# Patient Record
Sex: Male | Born: 1935 | Race: White | Hispanic: No | Smoking: Never smoker
Health system: Southern US, Community
[De-identification: ages and names within clinical notes are randomized; demographics above are authoritative.]

## PROBLEM LIST (undated history)

## (undated) DIAGNOSIS — B1920 Unspecified viral hepatitis C without hepatic coma: Secondary | ICD-10-CM

## (undated) DIAGNOSIS — C61 Malignant neoplasm of prostate: Secondary | ICD-10-CM

## (undated) DIAGNOSIS — J449 Chronic obstructive pulmonary disease, unspecified: Secondary | ICD-10-CM

## (undated) DIAGNOSIS — E119 Type 2 diabetes mellitus without complications: Secondary | ICD-10-CM

## (undated) HISTORY — PX: CHOLECYSTECTOMY: SHX55

---

## 2014-02-01 ENCOUNTER — Emergency Department (HOSPITAL_BASED_OUTPATIENT_CLINIC_OR_DEPARTMENT_OTHER): Payer: Medicare Other

## 2014-02-01 ENCOUNTER — Emergency Department (HOSPITAL_BASED_OUTPATIENT_CLINIC_OR_DEPARTMENT_OTHER)
Admission: EM | Admit: 2014-02-01 | Discharge: 2014-02-01 | Disposition: A | Discharge status: Other Acute Inpatient Hospital | Payer: Medicare Other | Attending: Emergency Medicine | Admitting: Emergency Medicine

## 2014-02-01 ENCOUNTER — Encounter (HOSPITAL_BASED_OUTPATIENT_CLINIC_OR_DEPARTMENT_OTHER): Payer: Self-pay | Admitting: Emergency Medicine

## 2014-02-01 DIAGNOSIS — M549 Dorsalgia, unspecified: Secondary | ICD-10-CM | POA: Insufficient documentation

## 2014-02-01 DIAGNOSIS — G9389 Other specified disorders of brain: Secondary | ICD-10-CM | POA: Diagnosis not present

## 2014-02-01 DIAGNOSIS — J449 Chronic obstructive pulmonary disease, unspecified: Secondary | ICD-10-CM | POA: Diagnosis not present

## 2014-02-01 DIAGNOSIS — R220 Localized swelling, mass and lump, head: Secondary | ICD-10-CM

## 2014-02-01 DIAGNOSIS — E119 Type 2 diabetes mellitus without complications: Secondary | ICD-10-CM | POA: Insufficient documentation

## 2014-02-01 DIAGNOSIS — R4182 Altered mental status, unspecified: Secondary | ICD-10-CM | POA: Insufficient documentation

## 2014-02-01 DIAGNOSIS — Z8619 Personal history of other infectious and parasitic diseases: Secondary | ICD-10-CM | POA: Insufficient documentation

## 2014-02-01 DIAGNOSIS — Z8546 Personal history of malignant neoplasm of prostate: Secondary | ICD-10-CM | POA: Insufficient documentation

## 2014-02-01 DIAGNOSIS — R404 Transient alteration of awareness: Secondary | ICD-10-CM | POA: Insufficient documentation

## 2014-02-01 DIAGNOSIS — J4489 Other specified chronic obstructive pulmonary disease: Secondary | ICD-10-CM | POA: Insufficient documentation

## 2014-02-01 DIAGNOSIS — R410 Disorientation, unspecified: Secondary | ICD-10-CM

## 2014-02-01 HISTORY — DX: Type 2 diabetes mellitus without complications: E11.9

## 2014-02-01 HISTORY — DX: Unspecified viral hepatitis C without hepatic coma: B19.20

## 2014-02-01 HISTORY — DX: Malignant neoplasm of prostate: C61

## 2014-02-01 HISTORY — DX: Chronic obstructive pulmonary disease, unspecified: J44.9

## 2014-02-01 LAB — CBC WITH DIFFERENTIAL/PLATELET
BASOS ABS: 0 10*3/uL (ref 0.0–0.1)
Basophils Relative: 0 % (ref 0–1)
EOS PCT: 3 % (ref 0–5)
Eosinophils Absolute: 0.3 10*3/uL (ref 0.0–0.7)
HEMATOCRIT: 46.9 % (ref 39.0–52.0)
Hemoglobin: 15.8 g/dL (ref 13.0–17.0)
LYMPHS ABS: 1.2 10*3/uL (ref 0.7–4.0)
LYMPHS PCT: 14 % (ref 12–46)
MCH: 31.9 pg (ref 26.0–34.0)
MCHC: 33.7 g/dL (ref 30.0–36.0)
MCV: 94.6 fL (ref 78.0–100.0)
MONO ABS: 0.6 10*3/uL (ref 0.1–1.0)
Monocytes Relative: 7 % (ref 3–12)
Neutro Abs: 6.5 10*3/uL (ref 1.7–7.7)
Neutrophils Relative %: 76 % (ref 43–77)
Platelets: 216 10*3/uL (ref 150–400)
RBC: 4.96 MIL/uL (ref 4.22–5.81)
RDW: 13.2 % (ref 11.5–15.5)
WBC: 8.6 10*3/uL (ref 4.0–10.5)

## 2014-02-01 LAB — URINALYSIS, ROUTINE W REFLEX MICROSCOPIC
Bilirubin Urine: NEGATIVE
Glucose, UA: NEGATIVE mg/dL
Hgb urine dipstick: NEGATIVE
KETONES UR: NEGATIVE mg/dL
Leukocytes, UA: NEGATIVE
NITRITE: NEGATIVE
PROTEIN: NEGATIVE mg/dL
Specific Gravity, Urine: 1.018 (ref 1.005–1.030)
Urobilinogen, UA: 1 mg/dL (ref 0.0–1.0)
pH: 5.5 (ref 5.0–8.0)

## 2014-02-01 LAB — COMPREHENSIVE METABOLIC PANEL
ALT: 33 U/L (ref 0–53)
AST: 32 U/L (ref 0–37)
Albumin: 3.3 g/dL — ABNORMAL LOW (ref 3.5–5.2)
Alkaline Phosphatase: 58 U/L (ref 39–117)
Anion gap: 11 (ref 5–15)
BUN: 17 mg/dL (ref 6–23)
CALCIUM: 9.5 mg/dL (ref 8.4–10.5)
CO2: 31 meq/L (ref 19–32)
CREATININE: 0.8 mg/dL (ref 0.50–1.35)
Chloride: 100 mEq/L (ref 96–112)
GFR, EST AFRICAN AMERICAN: 89 mL/min — AB (ref >90)
GFR, EST NON AFRICAN AMERICAN: 77 mL/min — AB (ref >90)
GLUCOSE: 179 mg/dL — AB (ref 70–99)
Potassium: 3.9 mEq/L (ref 3.7–5.3)
Sodium: 142 mEq/L (ref 137–147)
Total Bilirubin: 0.5 mg/dL (ref 0.3–1.2)
Total Protein: 6.7 g/dL (ref 6.0–8.3)

## 2014-02-01 LAB — CBG MONITORING, ED: Glucose-Capillary: 153 mg/dL — ABNORMAL HIGH (ref 70–99)

## 2014-02-01 LAB — TROPONIN I

## 2014-02-01 LAB — PRO B NATRIURETIC PEPTIDE: Pro B Natriuretic peptide (BNP): 327.5 pg/mL (ref 0–450)

## 2014-02-01 NOTE — ED Notes (Signed)
Pt presents to ED with family. Daughter states a week ago he fell hurt his upper back and stopped eating as much was seen at [facility]. Pt has not been acting himself since, not eating as much and not moving around as much and some disorientation. Patient lives alone.

## 2014-02-01 NOTE — ED Notes (Signed)
Transferred images via PACS to [facility] of CT head and DG chest 1 view at 2:43 pm on 02/01/14.

## 2014-02-01 NOTE — ED Notes (Signed)
Attempts for IV.Marland Kitchen

## 2014-02-01 NOTE — ED Provider Notes (Signed)
CSN: [id number]     Arrival date & time 02/01/14  1030 History   First MD Initiated Contact with Patient 02/01/14 1108     Chief Complaint  Patient presents with  . Altered Mental Status     (Consider location/radiation/quality/duration/timing/severity/associated sxs/prior Treatment) HPI 80 year old male presents with intermittent hallucinations and confusion over the last 2-3 days. Approximately one week ago he fell and injured his back at home. He followed up with his PCP earlier this week and was found to have a bronchitis and treated with Levaquin which she is finished with. He is also complaining of a transient headache and nausea that has since resolved. Has not had any fevers or chills. Patient lives by himself in family's been checking on him. They note that sometimes he is calling asking where they are in the middle the night at 4 AM. He's also confused on location intermittently as well as confused with thinking he's hearing children or grandchildren and his house. Family members confirmed there is no one in the house at the time.  Past Medical History  Diagnosis Date  . COPD (chronic obstructive pulmonary disease)   . Hepatitis C   . Diabetes mellitus without complication   . Prostate cancer    No past surgical history on file. No family history on file. History  Substance Use Topics  . Smoking status: Not on file  . Smokeless tobacco: Not on file  . Alcohol Use: Not on file    Review of Systems  Constitutional: Negative for fever.  Respiratory: Negative for shortness of breath.   Cardiovascular: Negative for chest pain.  Gastrointestinal: Negative for vomiting.  Genitourinary: Negative for dysuria.  Musculoskeletal: Positive for back pain.  Neurological: Negative for weakness.  Psychiatric/Behavioral: Positive for confusion.  All other systems reviewed and are negative.     Allergies  Review of patient's allergies indicates no known allergies.  Home  Medications   Prior to Admission medications   Not on File   BP 137/68  Pulse 89  Temp(Src) 98.3 F (36.8 C) (Oral)  Resp 18  Ht 6\' 1"  (1.854 m)  Wt 172 lb (78.019 kg)  BMI 22.70 kg/m2  SpO2 92% Physical Exam  Nursing note and vitals reviewed. Constitutional: He is oriented to person, place, and time. He appears well-developed and well-nourished.  HENT:  Head: Normocephalic and atraumatic.  Right Ear: External ear normal.  Left Ear: External ear normal.  Nose: Nose normal.  Eyes: EOM are normal. Pupils are equal, round, and reactive to light. Right eye exhibits no discharge. Left eye exhibits no discharge.  Neck: Neck supple.  Cardiovascular: Normal rate, regular rhythm, normal heart sounds and intact distal pulses.   Pulmonary/Chest: Effort normal and breath sounds normal.  Abdominal: Soft. He exhibits no distension. There is no tenderness.  Musculoskeletal: He exhibits no edema.  Neurological: He is alert and oriented to person, place, and time.  Mildly decreased strength in LLE compared to right. Equal strength in upper extremities. CN 2-12 grossly intact. Alert and oriented to self, place, day of week and month. Thinks it is 1920.  Skin: Skin is warm and dry.    ED Course  Procedures (including critical care time) Labs Review Labs Reviewed  COMPREHENSIVE METABOLIC PANEL - Abnormal; Notable for the following:    Glucose, Bld 179 (*)    Albumin 3.3 (*)    GFR calc non Af Amer 77 (*)    GFR calc Af Amer 89 (*)    All  other components within normal limits  CBG MONITORING, ED - Abnormal; Notable for the following:    Glucose-Capillary 153 (*)    All other components within normal limits  CBC WITH DIFFERENTIAL  TROPONIN I  URINALYSIS, ROUTINE W REFLEX MICROSCOPIC  PRO B NATRIURETIC PEPTIDE    Imaging Review Dg Chest 1 View  02/01/2014   CLINICAL DATA:  Altered mental status, decreased appetite  EXAM: CHEST - 1 VIEW  COMPARISON:  None  FINDINGS: Cardiomediastinal  silhouette is unremarkable. No acute infiltrate or pleural effusion. No pulmonary edema. Mild basilar atelectasis.  IMPRESSION: No acute infiltrate or pulmonary edema.  Mild basilar atelectasis.   Electronically Signed   By: Lahoma Crocker M.D.   On: 02/01/2014 12:19   Ct Head Wo Contrast  02/01/2014   CLINICAL DATA:  Altered mental status after fall.  EXAM: CT HEAD WITHOUT CONTRAST  TECHNIQUE: Contiguous axial images were obtained from the base of the skull through the vertex without intravenous contrast.  COMPARISON:  None.  FINDINGS: Mucosal thickening of bilateral ethmoid and sphenoid and left maxillary sinuses is noted. Bony calvarium is intact. Mild diffuse cortical atrophy is noted. Mild chronic ischemic white matter disease is noted. 19 mm rounded hyperdensity is seen in the suprasellar region concerning for possible aneurysm or mass. No mass effect or midline shift is noted. Ventricular size is within normal limits. There is no evidence of hemorrhage or acute infarction.  IMPRESSION: Findings consistent with ethmoid, sphenoid and left maxillary sinusitis.  Mild diffuse cortical atrophy. Mild chronic ischemic white matter disease.  19 mm rounded suprasellar hyperdensity is noted concerning for possible aneurysm or mass. Further evaluation with MRI or MRA is recommended.   Electronically Signed   By: Sabino Dick M.D.   On: 02/01/2014 12:19     EKG Interpretation   Date/Time:  Sunday February 01 2014 11:58:37 EDT Ventricular Rate:  89 PR Interval:  148 QRS Duration: 76 QT Interval:  372 QTC Calculation: 452 R Axis:   37 Text Interpretation:  Normal sinus rhythm Low voltage QRS Cannot rule out  Anterior infarct , age undetermined Abnormal ECG No old tracing to compare  Confirmed by Matthews ([id number]) on 02/01/2014 12:44:12 PM      MDM   Final diagnoses:  Delirium  Suprasellar mass    Patient with intermittent confusion consistent with delirium. Unknown source of his delirium  although there is a mass the suprasellar. Possible aneurysm as well although there is no evidence of blood. Given that he is been having intermittent confusion acutely he'll need admission to the hospital. Family prefers to be [facility] to [facility]. I discussed the case with Dr. Arther Dames, who accepts in transfer and admission. Will need MRI    Ephraim Hamburger, MD 02/01/14 1404

## 2014-02-01 NOTE — ED Notes (Signed)
CT head and 1 view cxr images were sent to [facility] pacs system by Darliss Ridgel radiology.

## 2014-02-01 NOTE — ED Notes (Signed)
Patient transported to CT 

## 2016-03-17 ENCOUNTER — Encounter (HOSPITAL_BASED_OUTPATIENT_CLINIC_OR_DEPARTMENT_OTHER): Payer: Self-pay | Admitting: *Deleted

## 2016-03-17 ENCOUNTER — Emergency Department (HOSPITAL_BASED_OUTPATIENT_CLINIC_OR_DEPARTMENT_OTHER): Payer: Medicare Other

## 2016-03-17 ENCOUNTER — Emergency Department (HOSPITAL_BASED_OUTPATIENT_CLINIC_OR_DEPARTMENT_OTHER)
Admission: EM | Admit: 2016-03-17 | Discharge: 2016-03-17 | Disposition: A | Discharge status: Home / Self Care | Payer: Medicare Other | Attending: Emergency Medicine | Admitting: Emergency Medicine

## 2016-03-17 DIAGNOSIS — Z8546 Personal history of malignant neoplasm of prostate: Secondary | ICD-10-CM | POA: Diagnosis not present

## 2016-03-17 DIAGNOSIS — Z7982 Long term (current) use of aspirin: Secondary | ICD-10-CM | POA: Diagnosis not present

## 2016-03-17 DIAGNOSIS — E119 Type 2 diabetes mellitus without complications: Secondary | ICD-10-CM | POA: Diagnosis not present

## 2016-03-17 DIAGNOSIS — R1030 Lower abdominal pain, unspecified: Secondary | ICD-10-CM

## 2016-03-17 DIAGNOSIS — Z79899 Other long term (current) drug therapy: Secondary | ICD-10-CM | POA: Insufficient documentation

## 2016-03-17 DIAGNOSIS — Z7984 Long term (current) use of oral hypoglycemic drugs: Secondary | ICD-10-CM | POA: Insufficient documentation

## 2016-03-17 DIAGNOSIS — J449 Chronic obstructive pulmonary disease, unspecified: Secondary | ICD-10-CM | POA: Diagnosis not present

## 2016-03-17 DIAGNOSIS — Z7951 Long term (current) use of inhaled steroids: Secondary | ICD-10-CM | POA: Insufficient documentation

## 2016-03-17 DIAGNOSIS — R197 Diarrhea, unspecified: Secondary | ICD-10-CM | POA: Insufficient documentation

## 2016-03-17 DIAGNOSIS — N5082 Scrotal pain: Secondary | ICD-10-CM | POA: Insufficient documentation

## 2016-03-17 LAB — COMPREHENSIVE METABOLIC PANEL
ALT: 13 U/L — AB (ref 17–63)
AST: 17 U/L (ref 15–41)
Albumin: 4 g/dL (ref 3.5–5.0)
Alkaline Phosphatase: 42 U/L (ref 38–126)
Anion gap: 6 (ref 5–15)
BUN: 19 mg/dL (ref 6–20)
CHLORIDE: 101 mmol/L (ref 101–111)
CO2: 30 mmol/L (ref 22–32)
CREATININE: 0.82 mg/dL (ref 0.61–1.24)
Calcium: 9.3 mg/dL (ref 8.9–10.3)
GFR calc non Af Amer: >60 mL/min (ref >60)
GLUCOSE: 103 mg/dL — AB (ref 65–99)
Potassium: 4.6 mmol/L (ref 3.5–5.1)
SODIUM: 137 mmol/L (ref 135–145)
Total Bilirubin: 0.8 mg/dL (ref 0.3–1.2)
Total Protein: 6.9 g/dL (ref 6.5–8.1)

## 2016-03-17 LAB — CBC WITH DIFFERENTIAL/PLATELET
Basophils Absolute: 0 K/uL (ref 0.0–0.1)
Basophils Relative: 0 %
Eosinophils Absolute: 0.3 K/uL (ref 0.0–0.7)
Eosinophils Relative: 4 %
HCT: 42.2 % (ref 39.0–52.0)
Hemoglobin: 13.9 g/dL (ref 13.0–17.0)
Lymphocytes Relative: 11 %
Lymphs Abs: 0.9 K/uL (ref 0.7–4.0)
MCH: 30.6 pg (ref 26.0–34.0)
MCHC: 32.9 g/dL (ref 30.0–36.0)
MCV: 93 fL (ref 78.0–100.0)
Monocytes Absolute: 0.6 K/uL (ref 0.1–1.0)
Monocytes Relative: 8 %
Neutro Abs: 6.1 K/uL (ref 1.7–7.7)
Neutrophils Relative %: 77 %
Platelets: 247 K/uL (ref 150–400)
RBC: 4.54 MIL/uL (ref 4.22–5.81)
RDW: 13.5 % (ref 11.5–15.5)
WBC: 8 K/uL (ref 4.0–10.5)

## 2016-03-17 LAB — URINALYSIS, ROUTINE W REFLEX MICROSCOPIC
BILIRUBIN URINE: NEGATIVE
Glucose, UA: NEGATIVE mg/dL
Ketones, ur: 15 mg/dL — AB
Leukocytes, UA: NEGATIVE
NITRITE: NEGATIVE
PROTEIN: NEGATIVE mg/dL
Specific Gravity, Urine: 1.019 (ref 1.005–1.030)
pH: 5.5 (ref 5.0–8.0)

## 2016-03-17 LAB — URINE MICROSCOPIC-ADD ON

## 2016-03-17 NOTE — ED Notes (Signed)
Pt able to stand and pivot from wheelchair to bed via 1 person MAX assist.

## 2016-03-17 NOTE — ED Provider Notes (Signed)
[facility] Provider Note   CSN: [id number] Arrival date & time: 03/17/16  1601     History   Chief Complaint Chief Complaint  Patient presents with  . Groin Pain    HPI Lee Frazier is a 82 y.o. male.Patient complains of scrotal pain for 6 months. His daughter reports that he only complained about to her today.. Patient denies difficulty with urination. No treatment prior to coming here. Other associated symptoms include diarrhea for several months. His last bowel movement was 3 days ago. He denies abdominal pain. No other associated symptoms. No nausea or vomiting. No fever. No treatment prior to coming here.  HPI  Past Medical History:  Diagnosis Date  . COPD (chronic obstructive pulmonary disease) ([place])   . Diabetes mellitus without complication ([place])   . Hepatitis C   . Prostate cancer ([place])     There are no active problems to display for this patient.   Past Surgical History:  Procedure Laterality Date  . CHOLECYSTECTOMY         Home Medications    Prior to Admission medications   Medication Sig Start Date End Date Taking? Authorizing Provider  Aspirin (ASPIR-81 PO) Take by mouth.   Yes Historical Provider, MD  Fluticasone Propionate HFA (FLOVENT HFA IN) Inhale into the lungs.   Yes Historical Provider, MD  Fluticasone-Salmeterol (ADVAIR HFA IN) Inhale into the lungs.   Yes Historical Provider, MD  glipiZIDE (GLUCOTROL) 5 MG tablet Take by mouth daily before breakfast.   Yes Historical Provider, MD  guaifenesin (MUCUS RELIEF) 400 MG TABS tablet Take 400 mg by mouth every 4 (four) hours.   Yes Historical Provider, MD  lisinopril (PRINIVIL,ZESTRIL) 10 MG tablet Take 10 mg by mouth daily.   Yes Historical Provider, MD  loperamide (IMODIUM) 2 MG capsule Take by mouth as needed for diarrhea or loose stools.   Yes Historical Provider, MD  Multiple Vitamin (THEREMS PO) Take by mouth.   Yes Historical Provider, MD  OXYGEN Inhale into the lungs.   Yes  Historical Provider, MD    Family History No family history on file.  Social History Social History  Substance Use Topics  . Smoking status: Never Smoker  . Smokeless tobacco: Never Used  . Alcohol use No     Allergies   Review of patient's allergies indicates no known allergies.   Review of Systems Review of Systems  Constitutional: Negative.   HENT: Negative.   Respiratory: Negative.   Cardiovascular: Negative.   Gastrointestinal: Negative.   Genitourinary: Positive for scrotal swelling.       Scrotal pain  Musculoskeletal: Positive for gait problem.       Walks with walker  Skin: Negative.   Allergic/Immunologic: Positive for immunocompromised state.       Diabetic  Psychiatric/Behavioral: Negative.   All other systems reviewed and are negative.    Physical Exam Updated Vital Signs BP 125/89 (BP Location: Right Arm)   Pulse 90   Temp 98.4 F (36.9 C)   Resp 18   Wt 190 lb (86.2 kg)   SpO2 94%   BMI 25.07 kg/m   Physical Exam  Constitutional:  Chronically ill-appearing  HENT:  Head: Normocephalic and atraumatic.  Eyes: Conjunctivae are normal. Pupils are equal, round, and reactive to light.  Neck: Neck supple. No tracheal deviation present. No thyromegaly present.  Cardiovascular: Normal rate and regular rhythm.   No murmur heard. Pulmonary/Chest: Effort normal and breath sounds normal.  Abdominal: Soft. Bowel sounds  are normal. He exhibits no distension. There is no tenderness.  Genitourinary: Penis normal.  Genitourinary Comments: Grapefruit size scrotum, him a firm minimally tender diffusely  Musculoskeletal: Normal range of motion. He exhibits no edema or tenderness.  Neurological: He is alert. Coordination normal.  Skin: Skin is warm and dry. No rash noted.  Psychiatric: He has a normal mood and affect.  Nursing note and vitals reviewed.    ED Treatments / Results  Labs (all labs ordered are listed, but only abnormal results are  displayed) Labs Reviewed  URINALYSIS, ROUTINE W REFLEX MICROSCOPIC (NOT AT [facility])  BASIC METABOLIC PANEL  CBC WITH DIFFERENTIAL/PLATELET    EKG  EKG Interpretation None       Radiology No results found.  Procedures Procedures (including critical care time)  Medications Ordered in ED Medications - No data to display Results for orders placed or performed during the hospital encounter of 03/17/16  Urinalysis, Routine w reflex microscopic (not at [facility])  Result Value Ref Range   Color, Urine YELLOW YELLOW   APPearance CLEAR CLEAR   Specific Gravity, Urine 1.019 1.005 - 1.030   pH 5.5 5.0 - 8.0   Glucose, UA NEGATIVE NEGATIVE mg/dL   Hgb urine dipstick SMALL (A) NEGATIVE   Bilirubin Urine NEGATIVE NEGATIVE   Ketones, ur 15 (A) NEGATIVE mg/dL   Protein, ur NEGATIVE NEGATIVE mg/dL   Nitrite NEGATIVE NEGATIVE   Leukocytes, UA NEGATIVE NEGATIVE  CBC with Differential/Platelet  Result Value Ref Range   WBC 8.0 4.0 - 10.5 K/uL   RBC 4.54 4.22 - 5.81 MIL/uL   Hemoglobin 13.9 13.0 - 17.0 g/dL   HCT 42.2 39.0 - 52.0 %   MCV 93.0 78.0 - 100.0 fL   MCH 30.6 26.0 - 34.0 pg   MCHC 32.9 30.0 - 36.0 g/dL   RDW 13.5 11.5 - 15.5 %   Platelets 247 150 - 400 K/uL   Neutrophils Relative % 77 %   Neutro Abs 6.1 1.7 - 7.7 K/uL   Lymphocytes Relative 11 %   Lymphs Abs 0.9 0.7 - 4.0 K/uL   Monocytes Relative 8 %   Monocytes Absolute 0.6 0.1 - 1.0 K/uL   Eosinophils Relative 4 %   Eosinophils Absolute 0.3 0.0 - 0.7 K/uL   Basophils Relative 0 %   Basophils Absolute 0.0 0.0 - 0.1 K/uL  Comprehensive metabolic panel  Result Value Ref Range   Sodium 137 135 - 145 mmol/L   Potassium 4.6 3.5 - 5.1 mmol/L   Chloride 101 101 - 111 mmol/L   CO2 30 22 - 32 mmol/L   Glucose, Bld 103 (H) 65 - 99 mg/dL   BUN 19 6 - 20 mg/dL   Creatinine, Ser 0.82 0.61 - 1.24 mg/dL   Calcium 9.3 8.9 - 10.3 mg/dL   Total Protein 6.9 6.5 - 8.1 g/dL   Albumin 4.0 3.5 - 5.0 g/dL   AST 17 15 - 41 U/L   ALT 13  (L) 17 - 63 U/L   Alkaline Phosphatase 42 38 - 126 U/L   Total Bilirubin 0.8 0.3 - 1.2 mg/dL   GFR calc non Af Amer >60 >60 mL/min   GFR calc Af Amer >60 >60 mL/min   Anion gap 6 5 - 15  Urine microscopic-add on  Result Value Ref Range   Squamous Epithelial / LPF 0-5 (A) NONE SEEN   WBC, UA 0-5 0 - 5 WBC/hpf   RBC / HPF 6-30 0 - 5 RBC/hpf   Bacteria,  UA RARE (A) NONE SEEN   Urine-Other MUCOUS PRESENT    US Scrotum  Result Date: 03/17/2016 CLINICAL DATA:  Scrotal swelling and pain over the past 6 months . EXAM: SCROTAL ULTRASOUND DOPPLER ULTRASOUND OF THE TESTICLES TECHNIQUE: Complete ultrasound examination of the testicles, epididymis, and other scrotal structures was performed. Color and spectral Doppler ultrasound were also utilized to evaluate blood flow to the testicles. COMPARISON:  None. FINDINGS: Right testicle Measurements: 4.1 x 2 x 2.3 cm. Anechoic 1.6 x 1.4 x 1.8 cm right upper pole testicular cyst with small internal indentation. No solid appearing intratesticular mass. Left testicle Measurements: 2.7 x 1.8 x 2.2 cm. No solid lesions. Upper pole 0.8 x 0.3 x 0.5 cm anechoic cyst. Right epididymis:  Not visualize Left epididymis:  Not visualized Hydrocele:  None visualized. Varicocele: Patient could not perform Valsalva of for assessment of varicoceles. Pulsed Doppler interrogation of both testes demonstrates normal low resistance arterial and venous waveforms bilaterally. Other: The testicles are displaced caudad by a but may represent some bulging of bowel cephalad to it. It is uncertain whether these represent areas of herniating bowel. Inguinal hernia is a clinical diagnosis. IMPRESSION: Downward bulging of bowel in the midline of the scrotum which may represent a hernia. The scrotal sac however is not filled with bowel. 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 No solid-appearing masses or evidence of testicular torsion. Electronically Signed   By: Ashley Royalty M.D.   On: 03/17/2016 18:12   [place] Art/ven Flow Abd Pelv Doppler  Result Date: 03/17/2016 CLINICAL DATA:  Scrotal swelling and pain over the past 6 months . EXAM: SCROTAL ULTRASOUND DOPPLER ULTRASOUND OF THE TESTICLES TECHNIQUE: Complete ultrasound examination of the testicles, epididymis, and other scrotal structures was performed. Color and spectral Doppler ultrasound were also utilized to evaluate blood flow to the testicles. COMPARISON:  None. FINDINGS: Right testicle Measurements: 4.1 x 2 x 2.3 cm. Anechoic 1.6 x 1.4 x 1.8 cm right upper pole testicular cyst with small internal indentation. No solid appearing intratesticular mass. Left testicle Measurements: 2.7 x 1.8 x 2.2 cm. No solid lesions. Upper pole 0.8 x 0.3 x 0.5 cm anechoic cyst. Right epididymis:  Not visualize Left epididymis:  Not visualized Hydrocele:  None visualized. Varicocele: Patient could not perform Valsalva of for assessment of varicoceles. Pulsed Doppler interrogation of both testes demonstrates normal low resistance arterial and venous waveforms bilaterally. Other: The testicles are displaced caudad by a but may represent some bulging of bowel cephalad to it. It is uncertain whether these represent areas of herniating bowel. Inguinal hernia is a clinical diagnosis. IMPRESSION: Downward bulging of bowel in the midline of the scrotum which may represent a hernia. The scrotal sac however is not filled with bowel. 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 No solid-appearing masses or evidence of testicular torsion. Electronically Signed   By: Ashley Royalty M.D.   On: 03/17/2016 18:12    Initial Impression / Assessment and Plan / ED Course  I have reviewed the triage vital signs and the nursing notes.  Pertinent labs & imaging results that were available during my care of the patient were reviewed by me and considered in my medical decision making (see chart for details).  Clinical Course    7:10 PM  and comfortably. Asymptomatic. Clinically likely cause of scrotal swelling is hernia. Doubt incarceration as patient is pain-free presently. His daughter request referral to gastric urologist. I suggest she contact primary care  physician for referral but I will refer her to GI on call. Also refer patient to [facility] surgery Final Clinical Impressions(s) / ED Diagnoses  Diagnosis #1 scrotal pain and swelling #2 chronic diarrhea Final diagnoses:  None    New Prescriptions New Prescriptions   No medications on file     Orlie Dakin, MD 03/17/16 1916

## 2016-03-17 NOTE — ED Triage Notes (Signed)
Groin pain. Testicles are swollen. He lives in an assisted living facility. Her daughter brought him here for evaluation.

## 2016-03-17 NOTE — ED Notes (Signed)
Pt is unsure how long his testicles have been swollen. Pt lives at assisted living and is not evaluated by a nurse. Pt daughter states today pt states the pain kept him up last night. Per daughter, pt has also been having diarrhea for several months.

## 2016-03-17 NOTE — Discharge Instructions (Signed)
We suspect that you have a hernia based on your exam and the ultrasound performed today. Call [facility] surgery if you wish to discuss surgery. If you develop severe pain in your scrotum vomiting or fever, return to the emergency department immediately. Call [facility] gastroenterology or contact your primary care physician for referral to gastroenterologist to discuss chronic diarrhea. Avoid milk or foods containing milk such as cheese or ice cream all having diarrhea. You can take Imodium as directed for diarrhea

## 2018-01-06 IMAGING — US US ART/VEN ABD/PELV/SCROTUM DOPPLER LTD
1 series · 13 of 25 positions shown · non-contrast
Comparison: None.

CLINICAL DATA: Scrotal swelling and pain over the past 6 months .

EXAM:
SCROTAL ULTRASOUND
DOPPLER ULTRASOUND OF THE TESTICLES
TECHNIQUE: Complete ultrasound examination of the testicles, epididymis, and
other scrotal structures was performed. Color and spectral Doppler
ultrasound were also utilized to evaluate blood flow to the
testicles.

[Series 1: us art/ven abd/pelv/scrotum doppler ltd · 0.08mm/px · 13 of 81 slices shown]
[im 1/81]
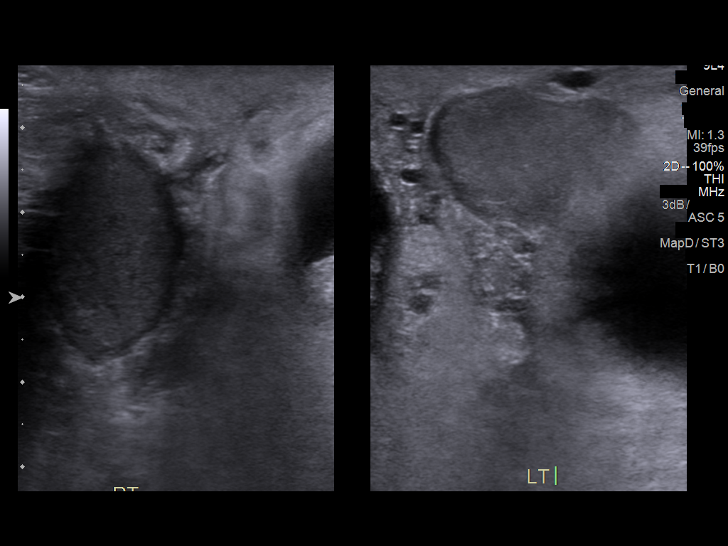
[im 7/81]
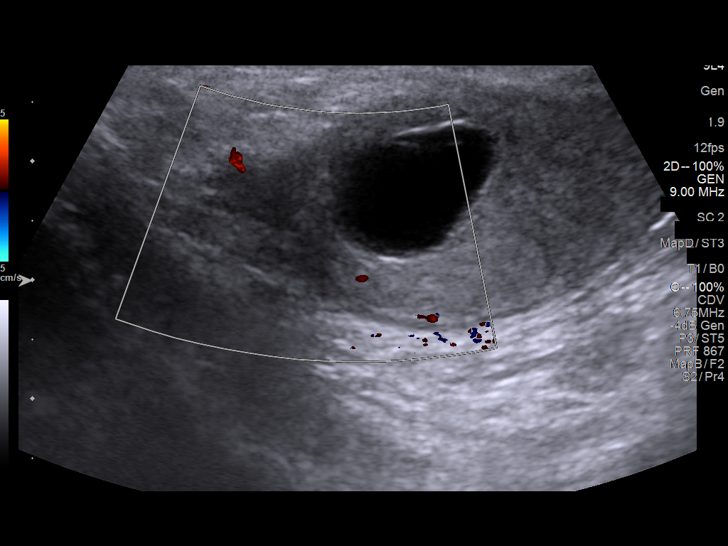
[im 14/81]
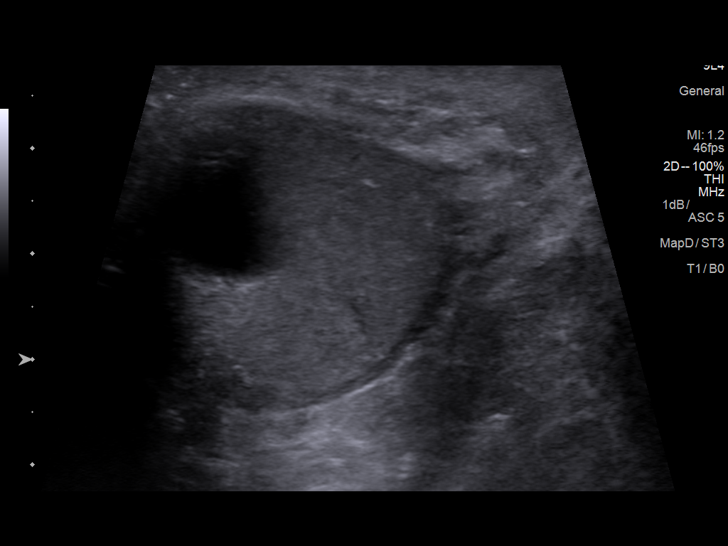
[im 21/81]
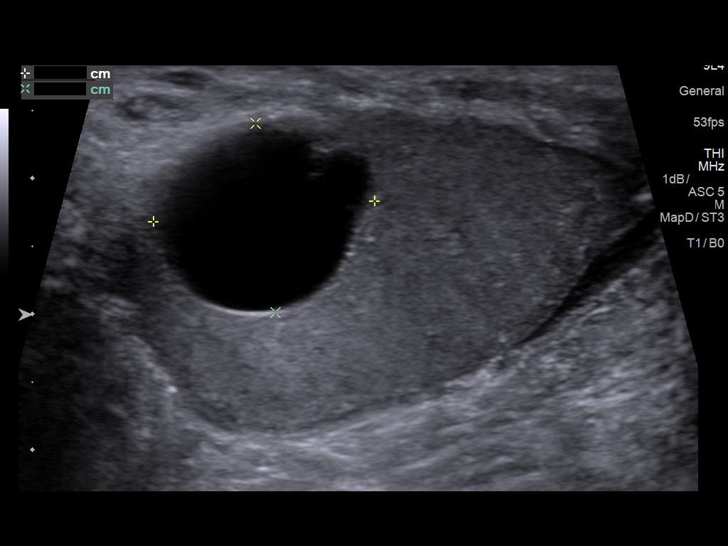
[im 27/81]
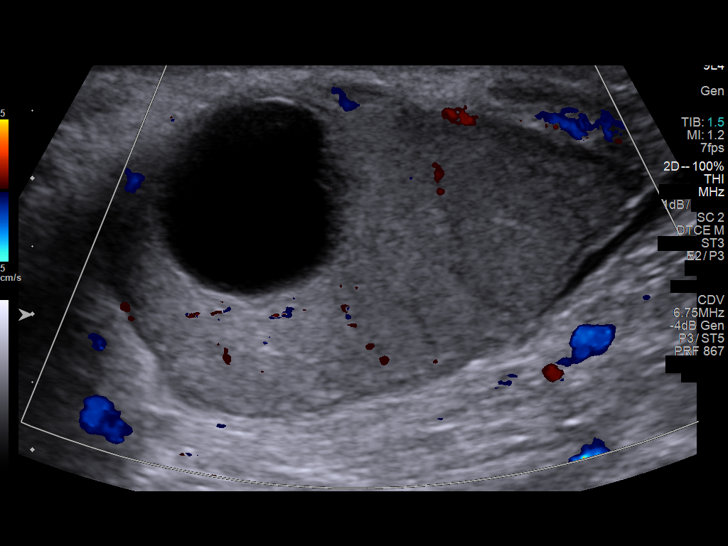
[im 34/81]
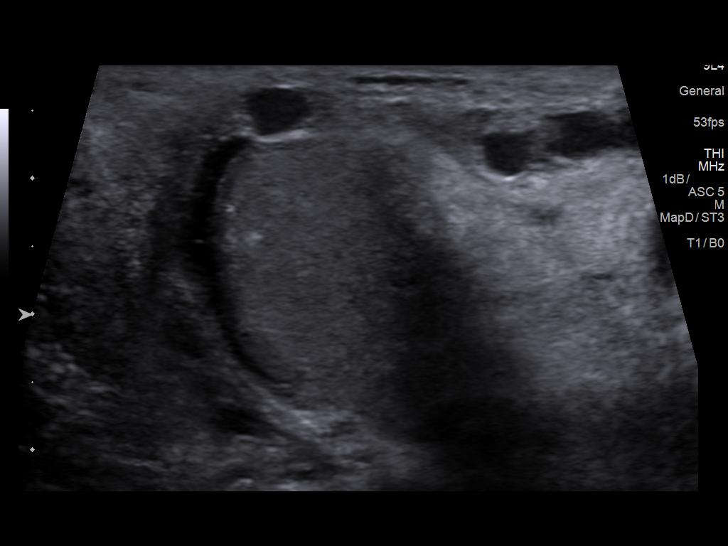
[im 41/81]
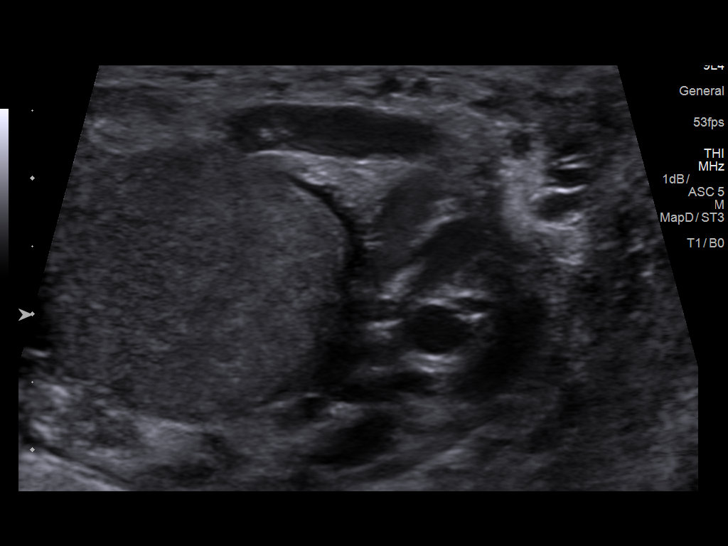
[im 47/81]
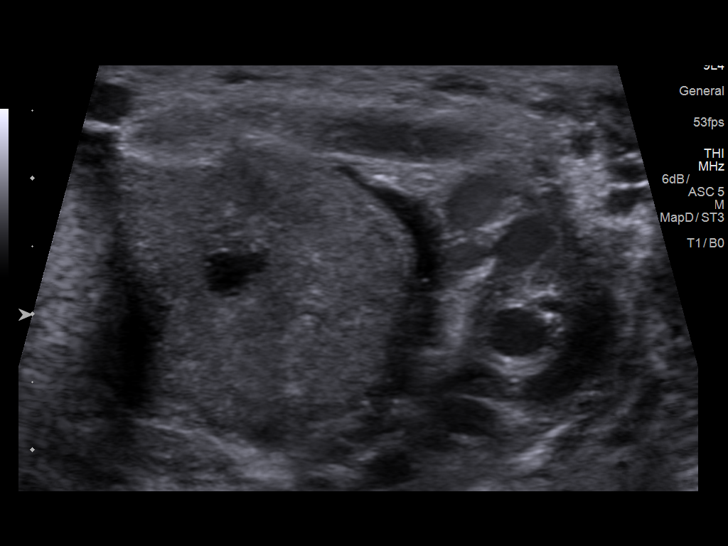
[im 54/81]
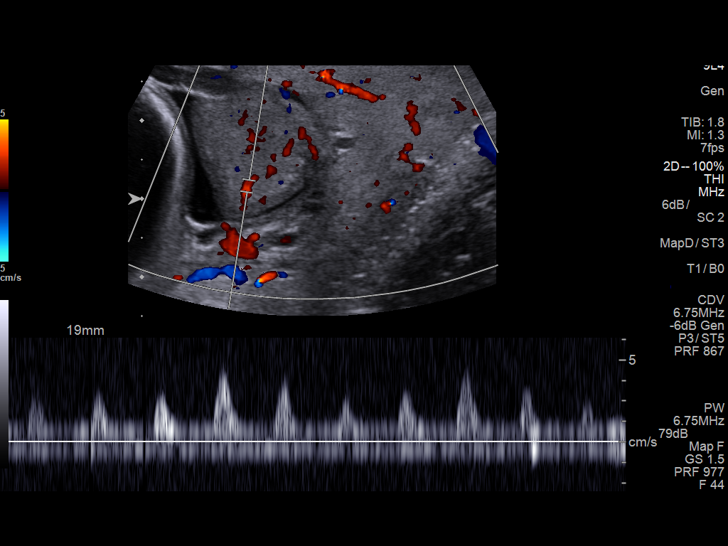
[im 61/81]
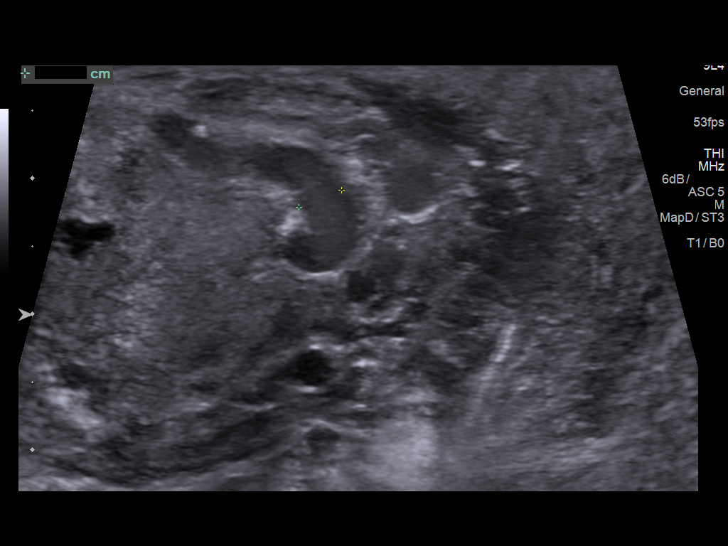
[im 67/81]
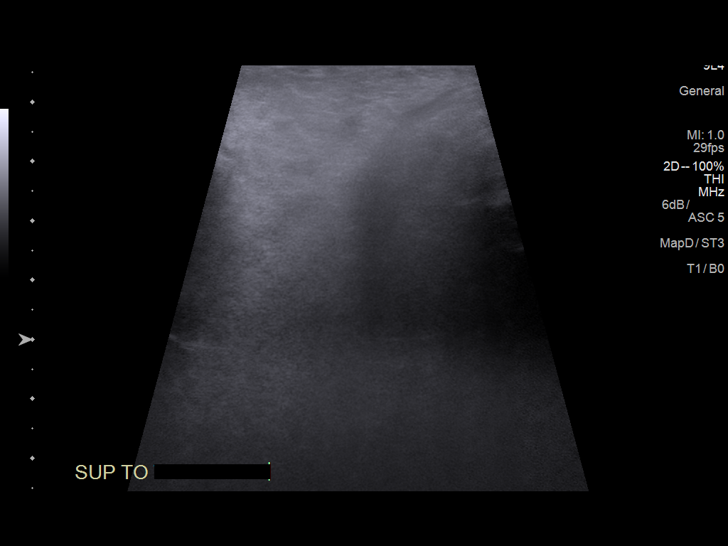
[im 74/81]
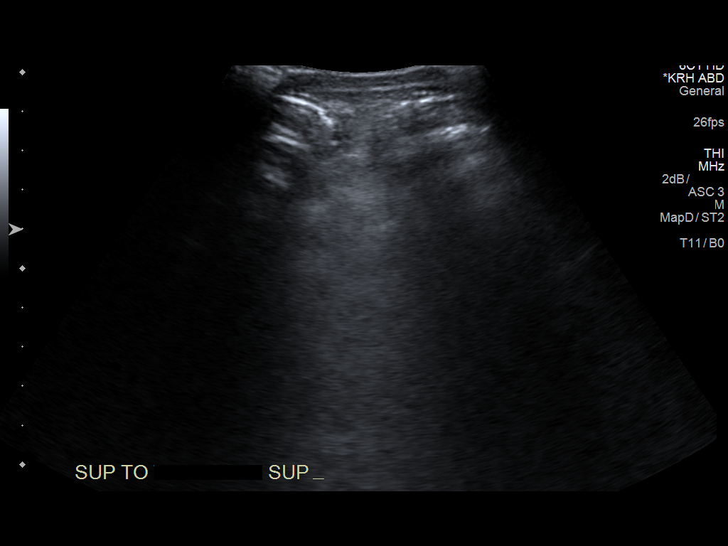
[im 81/81]
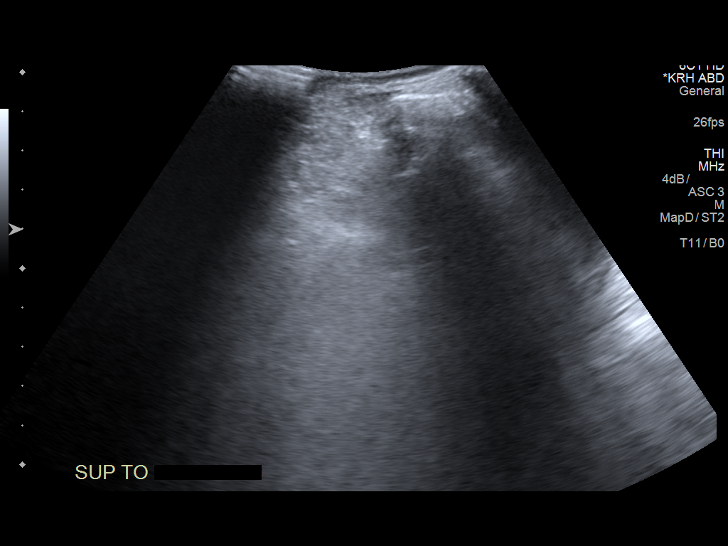

[13 of 25 positions shown; findings below may reference images not displayed]

FINDINGS: Right testicle

Measurements: 4.1 x 2 x 2.3 cm. Anechoic 1.6 x 1.4 x 1.8 cm right
upper pole testicular cyst with small internal indentation. No solid
appearing intratesticular mass.

Left testicle

Measurements: 2.7 x 1.8 x 2.2 cm. No solid lesions. Upper pole 0.8 x
0.3 x 0.5 cm anechoic cyst.

Right epididymis:  Not visualize

Left epididymis:  Not visualized

Hydrocele:  None visualized.

Varicocele: Patient could not perform Valsalva of for assessment of
varicoceles.

Pulsed Doppler interrogation of both testes demonstrates normal low
resistance arterial and venous waveforms bilaterally.

Other: The testicles are displaced caudad by a but may represent
some bulging of bowel cephalad to it. It is uncertain whether these
represent areas of herniating bowel. Inguinal hernia is a clinical
diagnosis.
IMPRESSION: Downward bulging of bowel in the midline of the scrotum which may
represent a hernia. The scrotal sac however is not filled with
bowel. Bilateral testicular cysts are seen possibly in the tunica
albuginea. No solid-appearing masses or evidence of testicular
torsion.

## 2018-10-11 DEATH — deceased
